# Patient Record
Sex: Female | Born: 1935 | Race: White | Hispanic: No
Health system: Southern US, Community
[De-identification: ages and names within clinical notes are randomized; demographics above are authoritative.]

---

## 2007-09-28 ENCOUNTER — Other Ambulatory Visit: Payer: Self-pay

## 2007-09-28 ENCOUNTER — Emergency Department: Payer: Self-pay | Admitting: Emergency Medicine

## 2008-04-15 ENCOUNTER — Emergency Department: Payer: Self-pay | Admitting: Emergency Medicine

## 2010-07-16 ENCOUNTER — Emergency Department: Payer: Self-pay | Admitting: Unknown Physician Specialty

## 2012-01-28 ENCOUNTER — Inpatient Hospital Stay: Payer: Self-pay | Admitting: General Practice

## 2012-01-28 LAB — URINALYSIS, COMPLETE
Bilirubin,UR: NEGATIVE
Blood: NEGATIVE
Ketone: NEGATIVE
Nitrite: NEGATIVE
Protein: NEGATIVE

## 2012-01-28 LAB — BASIC METABOLIC PANEL
Anion Gap: 8 (ref 7–16)
Calcium, Total: 9.8 mg/dL (ref 8.5–10.1)
EGFR (Non-African Amer.): 32 — ABNORMAL LOW
Glucose: 110 mg/dL — ABNORMAL HIGH (ref 65–99)
Osmolality: 296 (ref 275–301)
Potassium: 4.2 mmol/L (ref 3.5–5.1)
Sodium: 143 mmol/L (ref 136–145)

## 2012-01-28 LAB — CBC
HGB: 12.8 g/dL (ref 12.0–16.0)
MCH: 33.9 pg (ref 26.0–34.0)
MCHC: 34.9 g/dL (ref 32.0–36.0)
MCV: 97 fL (ref 80–100)
RBC: 3.79 10*6/uL — ABNORMAL LOW (ref 3.80–5.20)

## 2012-01-28 LAB — PROTIME-INR
INR: 1
Prothrombin Time: 13.5 secs (ref 11.5–14.7)

## 2012-01-29 LAB — CBC WITH DIFFERENTIAL/PLATELET
Eosinophil %: 0.3 %
HCT: 31.8 % — ABNORMAL LOW (ref 35.0–47.0)
Lymphocyte #: 1.1 10*3/uL (ref 1.0–3.6)
MCH: 33.2 pg (ref 26.0–34.0)
MCHC: 33.7 g/dL (ref 32.0–36.0)
MCV: 99 fL (ref 80–100)
Monocyte #: 0.9 x10 3/mm (ref 0.2–0.9)
Monocyte %: 7.5 %
Platelet: 131 10*3/uL — ABNORMAL LOW (ref 150–440)
RBC: 3.23 10*6/uL — ABNORMAL LOW (ref 3.80–5.20)
RDW: 12.1 % (ref 11.5–14.5)

## 2012-01-29 LAB — BASIC METABOLIC PANEL
Anion Gap: 8 (ref 7–16)
Calcium, Total: 8.2 mg/dL — ABNORMAL LOW (ref 8.5–10.1)
Chloride: 113 mmol/L — ABNORMAL HIGH (ref 98–107)
Co2: 22 mmol/L (ref 21–32)
Creatinine: 1.49 mg/dL — ABNORMAL HIGH (ref 0.60–1.30)
EGFR (African American): 36 — ABNORMAL LOW
Glucose: 149 mg/dL — ABNORMAL HIGH (ref 65–99)
Osmolality: 294 (ref 275–301)
Sodium: 143 mmol/L (ref 136–145)

## 2012-01-30 LAB — CBC WITH DIFFERENTIAL/PLATELET
Basophil %: 0.5 %
Eosinophil %: 2.7 %
HCT: 23.4 % — ABNORMAL LOW (ref 35.0–47.0)
HGB: 8.1 g/dL — ABNORMAL LOW (ref 12.0–16.0)
Lymphocyte #: 1.4 10*3/uL (ref 1.0–3.6)
Lymphocyte %: 20.4 %
MCH: 34.1 pg — ABNORMAL HIGH (ref 26.0–34.0)
MCV: 98 fL (ref 80–100)
Monocyte #: 0.6 x10 3/mm (ref 0.2–0.9)
Neutrophil #: 4.6 10*3/uL (ref 1.4–6.5)
RDW: 12.2 % (ref 11.5–14.5)
WBC: 6.9 10*3/uL (ref 3.6–11.0)

## 2012-01-30 LAB — BASIC METABOLIC PANEL
Anion Gap: 8 (ref 7–16)
BUN: 25 mg/dL — ABNORMAL HIGH (ref 7–18)
Chloride: 117 mmol/L — ABNORMAL HIGH (ref 98–107)
Co2: 21 mmol/L (ref 21–32)
Osmolality: 295 (ref 275–301)
Potassium: 3.9 mmol/L (ref 3.5–5.1)

## 2012-01-31 LAB — BASIC METABOLIC PANEL
Anion Gap: 9 (ref 7–16)
BUN: 17 mg/dL (ref 7–18)
Calcium, Total: 8.2 mg/dL — ABNORMAL LOW (ref 8.5–10.1)
Chloride: 114 mmol/L — ABNORMAL HIGH (ref 98–107)
Co2: 20 mmol/L — ABNORMAL LOW (ref 21–32)
Creatinine: 1.34 mg/dL — ABNORMAL HIGH (ref 0.60–1.30)
EGFR (African American): 41 — ABNORMAL LOW
Glucose: 121 mg/dL — ABNORMAL HIGH (ref 65–99)
Potassium: 3.8 mmol/L (ref 3.5–5.1)
Sodium: 143 mmol/L (ref 136–145)

## 2012-01-31 LAB — PATHOLOGY REPORT

## 2012-03-19 DEATH — deceased

## 2013-05-08 IMAGING — CR PELVIS - 1-2 VIEW
1 series · 1 of 1 positions shown · non-contrast
Comparison: none

REASON FOR EXAM: pain following trauma
COMMENTS:

[ap]
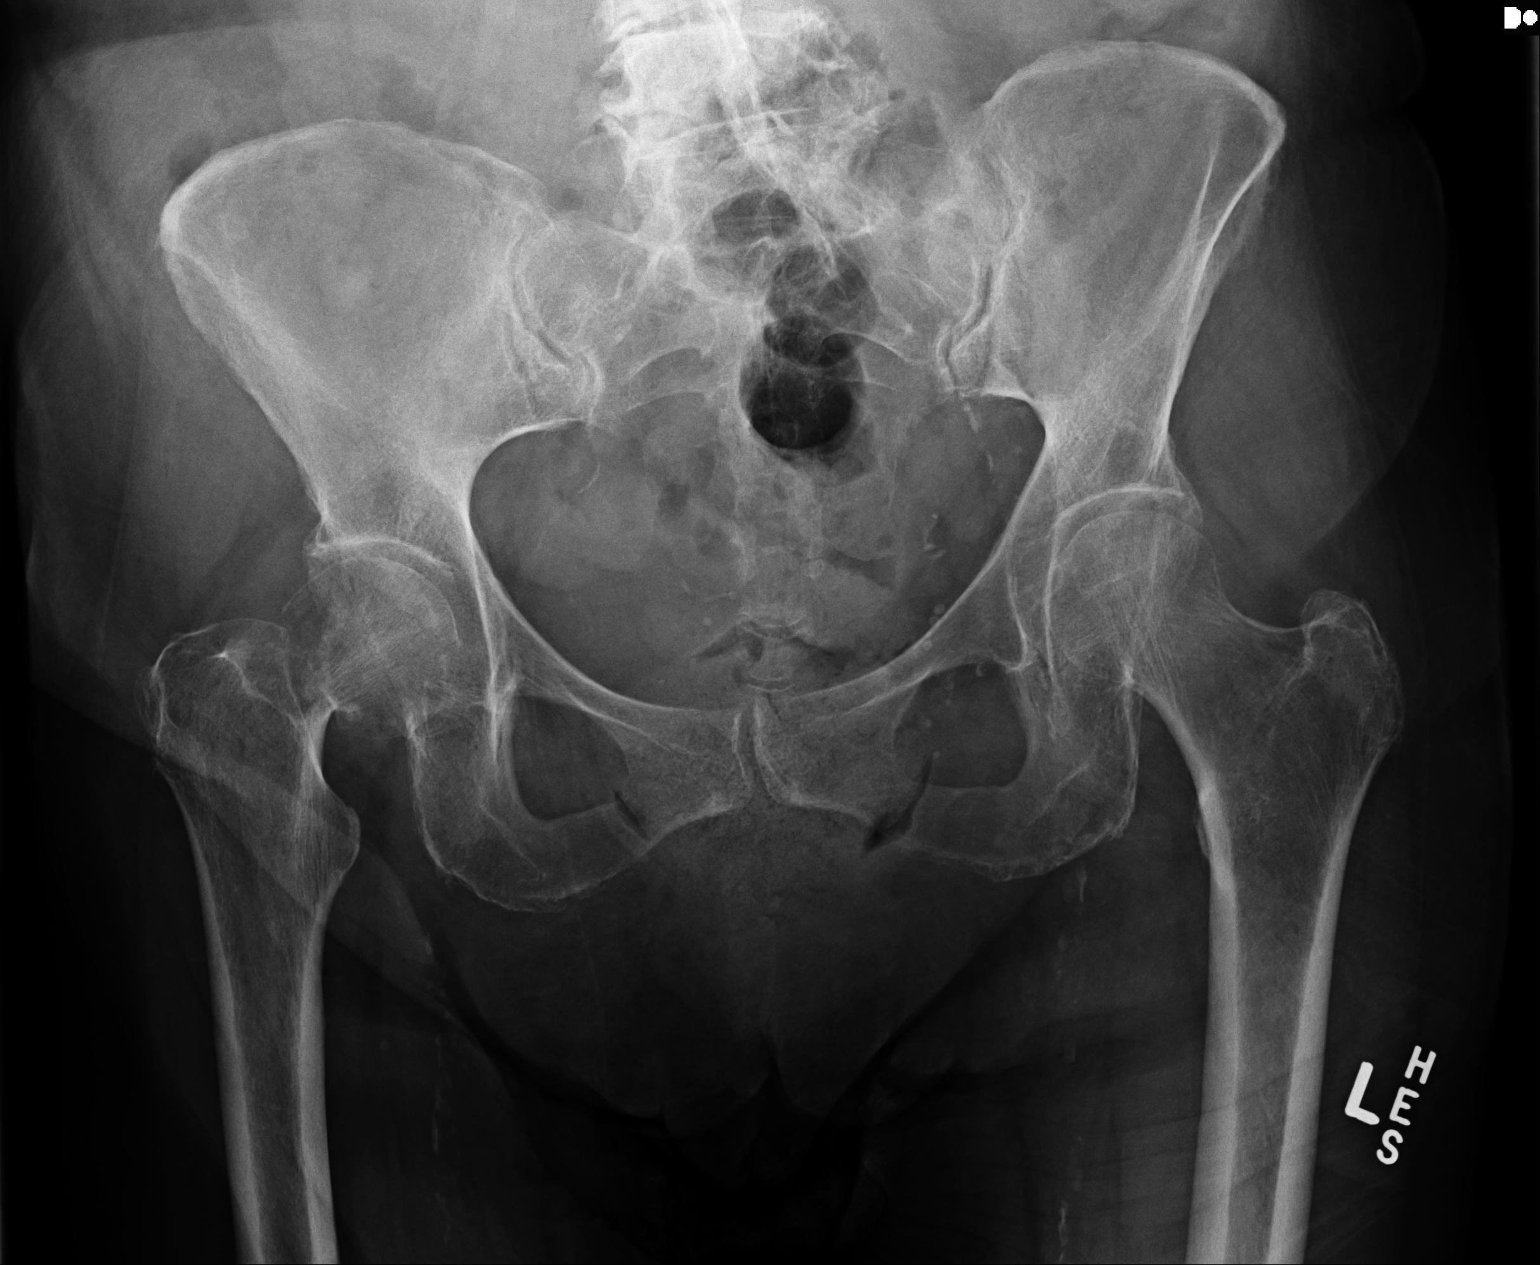

[1 of 1 positions shown; findings below may reference images not displayed]

PROCEDURE:     DXR - DXR PELVIS AP ONLY  - January 28, 2012 [DATE]

RESULT:     The bony pelvis is osteopenic. The patient has sustained an
acute subcapital fracture of the right hip. There are lucencies which appear
to lie outside the bone involving the inferior pubic rami bilaterally. There
is a moderate amount of stool in the rectum suggesting fecal impaction or
constipation. There are degenerative changes of the lower lumbar spine.
IMPRESSION: 1. The patient has sustained an acute subcapital fracture of the right hip.
There is diffuse osteopenia.
2. There is considerable stool present in the rectum.

[REDACTED]

## 2013-05-09 IMAGING — CR RIGHT HIP - COMPLETE 2+ VIEW
1 series · 3 of 3 positions shown · non-contrast
Comparison: none

REASON FOR EXAM: s/p hip hemiarthroplasty
COMMENTS:   Bedside (portable):Y

PROCEDURE:     DXR - DXR HIP RIGHT COMPLETE  - January 29, 2012  [DATE]
RESULT:     Comparison: 01/28/2012

[Series 1: ap · 0.17mm/px · 3 of 3 slices shown]
[im 1/3]
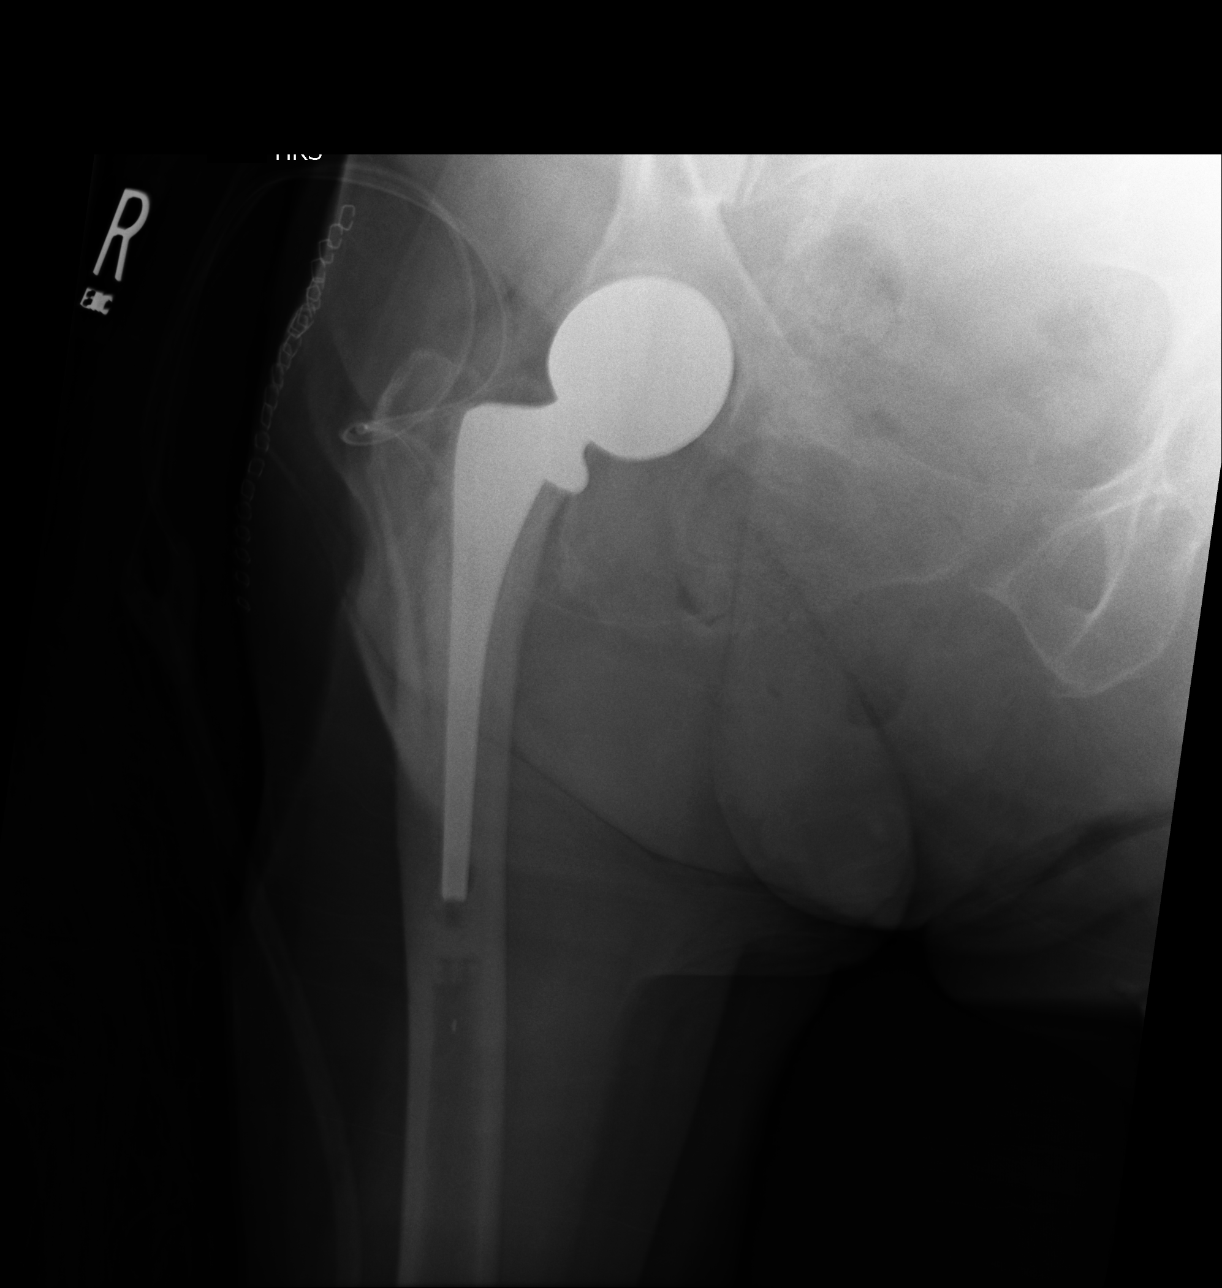
[im 2/3]
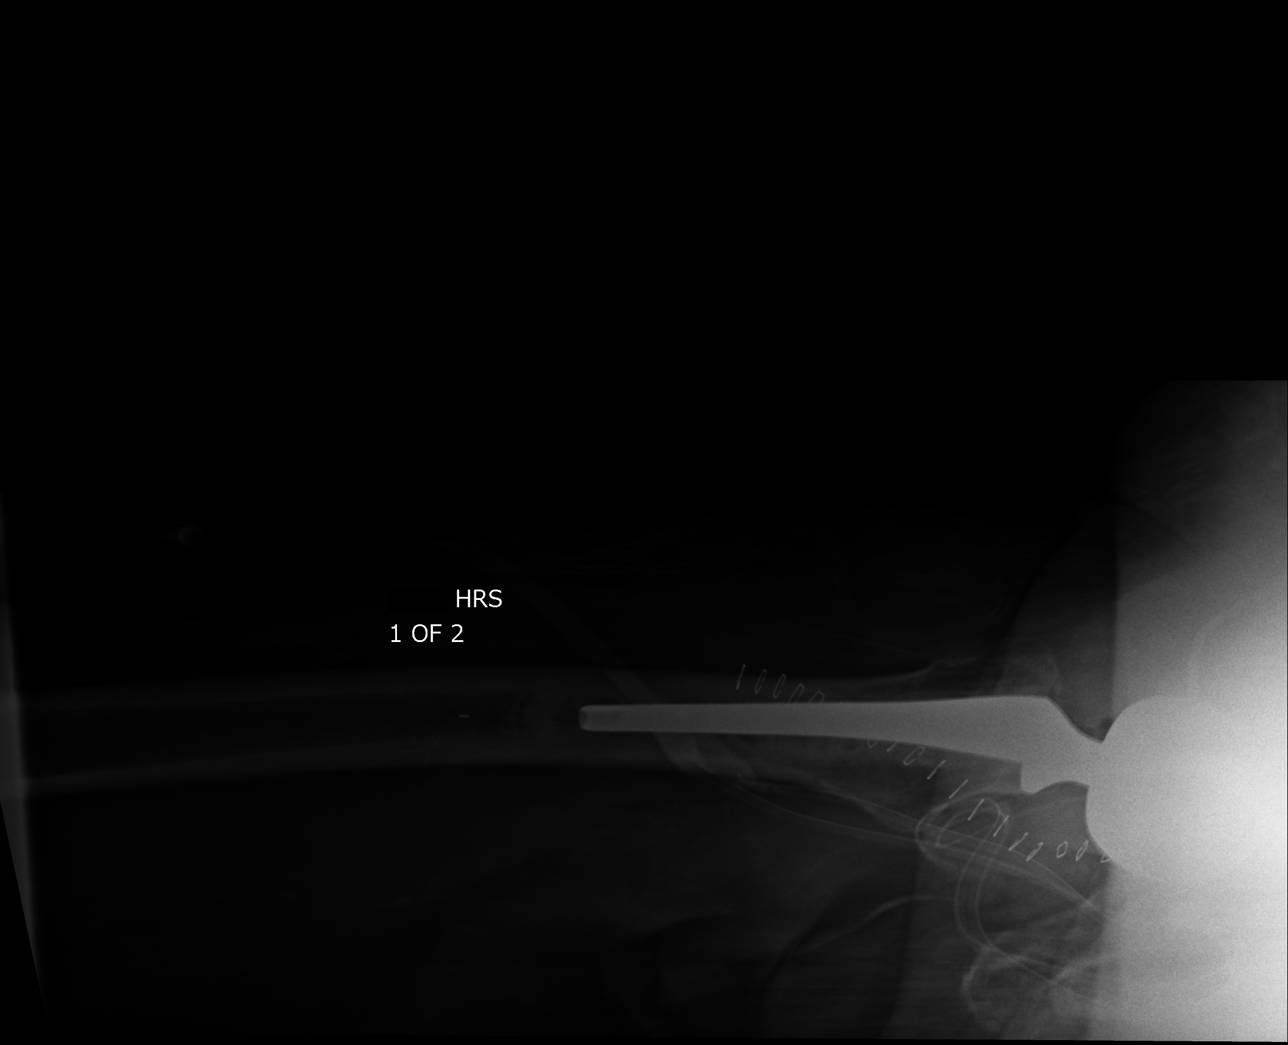
[im 3/3]
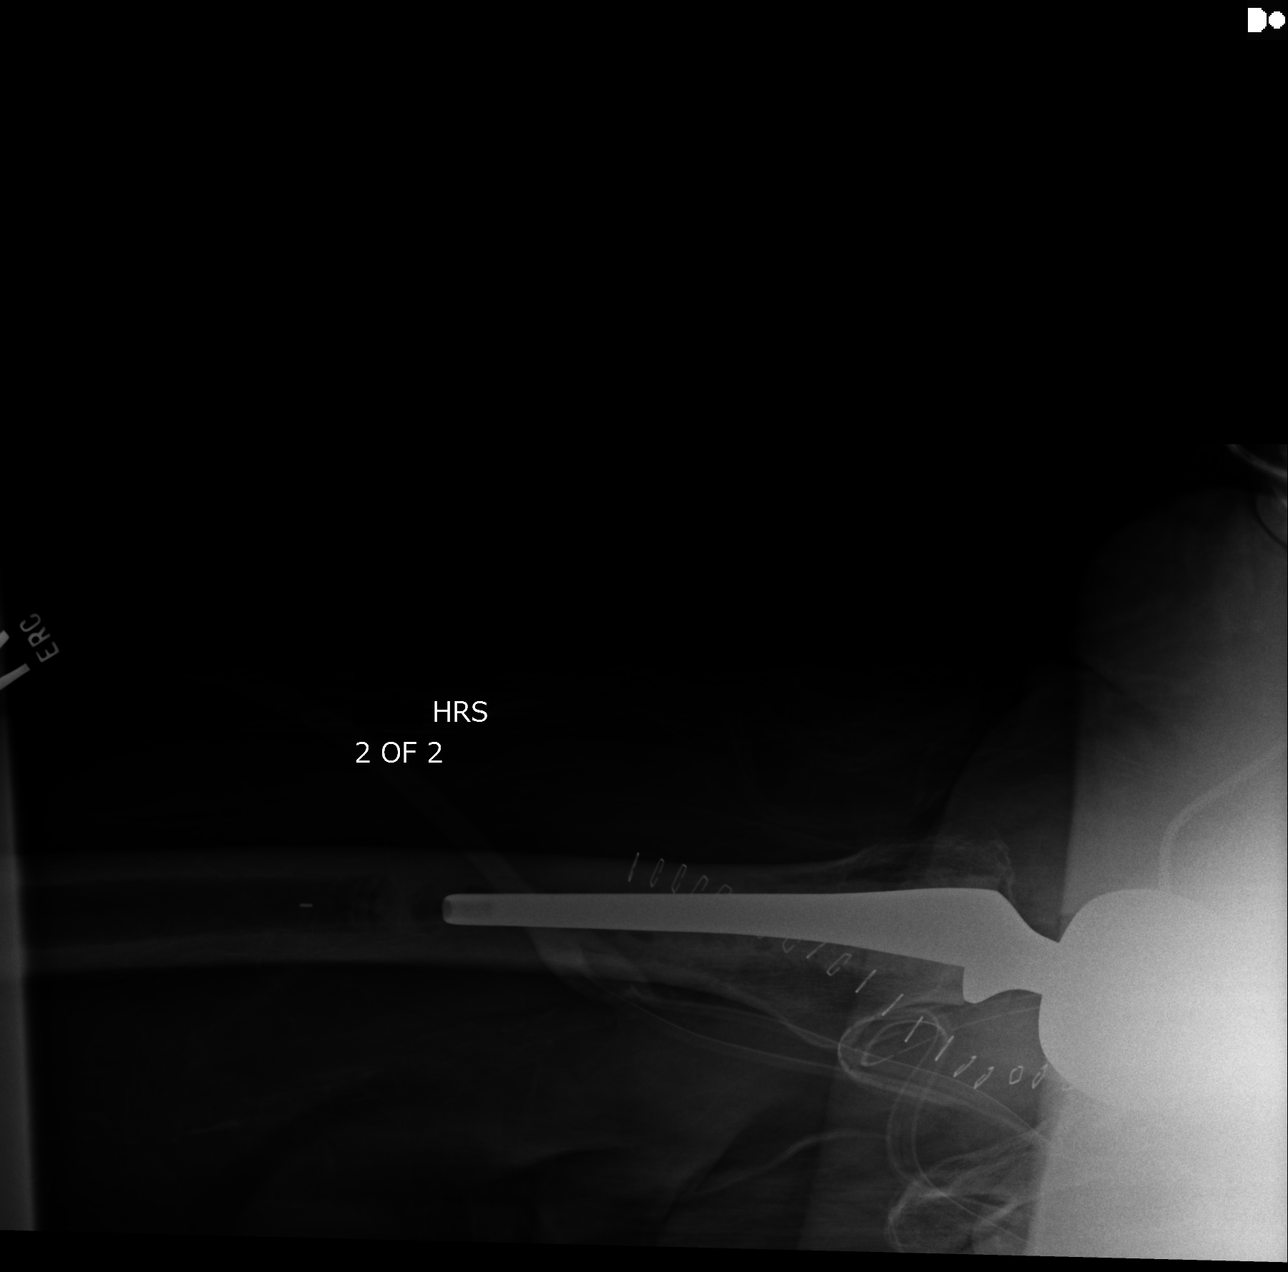

[3 of 3 positions shown; findings below may reference images not displayed]

FINDINGS: Hardware seen from right hip arthroplasty. Evaluation of the AP image is
slightly limited by motion artifact. Surgical drain and skin staples are
present.
IMPRESSION: Right hip arthroplasty.

## 2014-07-06 NOTE — Discharge Summary (Signed)
PATIENT NAME:  Morgan Strong, Morgan Strong MR#:  [id number] DATE OF BIRTH:  1921-10-02  DATE OF ADMISSION:  01/28/2012 DATE OF DISCHARGE:  01/31/2012   ADMITTING DIAGNOSIS: Displaced right femoral neck fracture.   DISCHARGE DIAGNOSIS: Displaced right femoral neck fracture.   HISTORY: The patient is an 81 year old female resident of a local assisted living facility who apparently had a mechanical fall on the day of admission. The patient is aphasic, but the staff noted that she was moaning with movement of the right leg and hip. The patient was ambulating with the assistance of a walker prior to the fall, but was becoming increasingly unstable. She subsequently was brought to [facility] ER where x-rays were taken of the hip and pelvis. This revealed a displaced right femoral neck fracture. After a discussion of the risks and benefits of surgical intervention with the patient's family members, they expressed their understanding of the risks and benefits and agreed for plans for surgical intervention.   CONSULTATION: Hospitalist, Dr. Allena Katz.   PROCEDURE: Right hip hemiarthroplasty.   ANESTHESIA: General.   IMPLANTS UTILIZED: DePuy size three Summit femoral component (cemented), 45 mm Cathcart hip ball, a +0 mm tapered spacer, and 11 mm centralizer, and a size three cement restrictor.   HOSPITAL COURSE: The patient tolerated the procedure very well. She had no complications. She was then taken to the PAC-U where she was stabilized and then transferred to the orthopedic floor. The patient began receiving anticoagulation therapy of Lovenox 30 mg subcutaneous every 12 hours per anesthesia protocol. She was fitted with TED stockings bilaterally. These were allowed to be removed one hour per eight hour shift. The patient was also fitted with the AV-I compression foot pumps bilaterally set at 80 mmHg. There has not been any evidence of any deep venous thromboses to the lower extremity. Calves  appear to be nontender. Negative Homans sign. Heels were elevated off the bed using rolled towels. The patient was also fitted with an abduction pillow at all times due to her dementia.   The patient has denied any chest pain or shortness of breath. Vital signs have been stable. Her pulse, however, has been running in the low 90s, but has been constant throughout the hospital course. The patient has been followed throughout the hospital course by Dr. Allena Katz and has been monitoring her vital signs and blood pressure medicines from a medical standpoint. The patient hemodynamically was stable and no transfusions were given. Her hemoglobin on day one following surgery did show a drop of 1.8 but this was from a 10.7 and was felt to be misread. A follow-up hemoglobin showed that she was 9.7. She was asymptomatic during this time frame.   Physical therapy was initiated on day one for gait training and transfers, however, due to her dementia this has been extremely difficult. She was able to ambulate 10 feet and also go to bed to chair with moderate assistance. Occupational therapy was also initiated on day one for activities of daily living and assistive devices.   The patient's IV, Foley and Hemovac were discontinued on day two along with a dressing change. The wound was free of any drainage or signs of infection. There was no bruising noted upon being discharged. The heels were nontender.   The patient is being discharged to skilled nursing facility in improved stable condition.   DISCHARGE INSTRUCTIONS:  1. She will continue with physical therapy for gait training and transfers, occupational therapy for activities of daily living and  assistive devices.  2. TED stockings to be worn at all times. These may be removed one hour per eight hour shift.  3. Elevate the heels off the bed. Abduction pillows between legs when in bed.  4. She may weight bear as tolerated.  5. Incentive spirometry every one hour while  awake.  6. Encourage cough and deep breathing every two hours while awake.  7. She is placed on a regular diet.  8. She will need to follow-up in the clinic in six weeks.  9. Staples will need to be removed at two weeks postoperative followed by the application of benzoin and Steri-Strips.   DRUG ALLERGIES: Sulfa.   MEDICATIONS:  1. Tylenol ES 500 to 1000 mg every four hours p.r.n. for pain and temperatures of greater than 100.4.  2. Roxicodone 5 to 10 mg every 4 to 6 hours p.r.n. for pain.  3. Ultram 50 to 100 mg every four hours p.r.n. for pain.  4. Milk of magnesia 30 mL b.i.d. p.r.n.  5. Dulcolax suppositories 10 mg rectally daily p.r.n. for constipation.  6. Enema soapsuds if no results with milk of magnesia or Dulcolax.  7. Mylanta DS 30 mL every six hours p.r.n.  8. Pantoprazole 40 mg b.i.d.  9. Risperdal 0.25 mg every six hours p.r.n.  10. Aricept 10 mg at bedtime. 11. Macrodantin 100 mg for three days and then discontinue.  12. Vitamin D3 1000 units daily.  13. Insulin sliding scale. 14. Novolin R injections. 15. Lovenox 30 mg subcutaneous q.12 hours for 14 days, then discontinue and begin taking one 81 mg enteric-coated aspirin.   PAST MEDICAL HISTORY:   1. Dementia.  2. Hyperlipidemia.  3. Goiter.  4. Osteoporosis.  5. Hypertension.  6. Diabetes type 2.  ____________________________ Van ClinesJon Wolfe, PA jrw:ap D: 01/31/2012 07:28:59 ET T: 01/31/2012 08:27:57 ET JOB#: [id number]  cc: Van ClinesJon Wolfe, PA, <Dictator>  JON WOLFE PA ELECTRONICALLY SIGNED 01/31/2012 20:20

## 2014-07-06 NOTE — H&P (Signed)
Subjective/Chief Complaint Right hip pain    History of Present Illness 81 year old female resident of a local assited iving facility who apparently had a mechanical fall. Patient is aphasic, but staff noted thet moaning with movement of the right leg and hip. Patient ambulated aids before this fall but was becoming increasingly unstable.   Past Med/Surgical Hx:  Dementia:   hyperlipidemia:   goiter:   osteoporosis:   HTN:   DM II:   ALLERGIES:  Sulfa: Rash  HOME MEDICATIONS: Medication Instructions Status  Benicar HCT 20 mg-12.5 mg oral tablet 1 tab(s) orally once a day (8 am) Active  metformin 500 mg oral tablet 0.5 tab(s) (250 mg) orally once a day with food (8 am) Active  nitrofurantoin macrocrystals 50 mg oral capsule 1 cap(s) orally once a day with food (8 am) Active  Senexon-S 50 mg-8.6 mg oral tablet 1 tab(s) orally once a day (8 am) Active  Vitamin D3 1000 intl units oral tablet 1 tab(s) orally once a day (8 am) Active  donepezil 10 mg oral tablet 1 tab(s) orally once a day (at bedtime) (8 pm) Active  Milk of Magnesia 8% oral suspension 30 milliliter(s) orally once a day, As Needed- for Constipation  Active  risperidone 0.25 mg oral tablet 1 tab(s) orally once a day, As Needed- for Agitation  Active  Secura Extra Protective topical cream Apply topically to redness of buttocks 2 times a day to prevent breakdown Active   Family and Social History:   Family History Non-Contributory    Social History negative tobacco, negative ETOH, negative Illicit drugs    Place of Living Nursing Home   Review of Systems:   ROS Pt not able to provide ROS   Physical Exam:   GEN well developed, well nourished, no acute distress, disheveled    HEENT pink conjunctivae, PERRL, Oropharynx clear    NECK supple    RESP normal resp effort  clear BS  postive use of accessory muscles    CARD regular rate  no murmur  No LE edema  no JVD    ABD denies tenderness  soft    GU foley  catheter in place    EXTR Right lower extremity is shortened and externally rotated. Pain is elicited with attempts at range of motion of the hip. No gross tenderness to palpation of the knee. No gross knee effusion.    SKIN Ato left pretibial area.    NEURO aphasic, motor/sensory function intact    PSYCH alert, poor insight   Lab Results: Routine BB:  11-Nov-13 12:11    ABO Group + Rh Type A Positive   Antibody Screen NEGATIVE (Result(s) reported on 28 Jan 2012 at 01:16PM.)  Routine Chem:  11-Nov-13 12:11    Glucose, Serum  110   BUN  42   Creatinine (comp)  1.45   Sodium, Serum 143   Potassium, Serum 4.2   CO2, Serum 23   Calcium (Total), Serum 9.8   Anion Gap 8   Osmolality (calc) 296   eGFR (African American)  37   eGFR (Non-African American)  32 (eGFR values <72mL/min/1.73 m2 may be an indication of chronic kidney disease (CKD). Calculated eGFR is useful in patients with stable renal function. The eGFR calculation will not be reliable in acutely ill patients when serum creatinine is changing rapidly. It is not useful in  patients on dialysis. The eGFR calculation may not be applicable to patients at the low and high  extremes of body sizes, pregnant women, and vegetarians.)  Routine UA:  11-Nov-13 12:48    Color (UA) Yellow   Clarity (UA) Cloudy   Glucose (UA) Negative   Bilirubin (UA) Negative   Ketones (UA) Negative   Specific Gravity (UA) 1.017   Blood (UA) Negative   pH (UA) 5.0   Protein (UA) Negative   Nitrite (UA) Negative   Leukocyte Esterase (UA) 2+ (Result(s) reported on 28 Jan 2012 at 01:23PM.)   RBC (UA) 3 /HPF   WBC (UA) 32 /HPF   Bacteria (UA) 3+   Epithelial Cells (UA) NONE SEEN   Mucous (UA) PRESENT (Result(s) reported on 28 Jan 2012 at 01:23PM.)  Routine Coag:  11-Nov-13 12:11    Prothrombin 13.5   INR 1.0 (INR reference interval applies to patients on anticoagulant therapy. A single INR therapeutic range for coumarins is not optimal for  all indications; however, the suggested range for most indications is 2.0 - 3.0. Exceptions to the INR Reference Range may include: Prosthetic heart valves, acute myocardial infarction, prevention of myocardial infarction, and combinations of aspirin and anticoagulant. The need for a higher or lower target INR must be assessed individually. Reference: The Pharmacology and Management of the Vitamin K  antagonists: the seventh ACCP Conference on Antithrombotic and Thrombolytic Therapy. BEMLJ.4492 Sept:126 (3suppl): [id number]. A HCT value >55% may artifactually increase the PT.  In one study,  the increase was an average of 25%. Reference:  "Effect on Routine and Special Coagulation Testing Values of Citrate Anticoagulant Adjustment in Patients with High HCT Values." American Journal of Clinical Pathology 2006;126:400-405.)  Routine Hem:  11-Nov-13 12:11    WBC (CBC) 7.2   RBC (CBC)  3.79   Hemoglobin (CBC) 12.8   Hematocrit (CBC) 36.8   Platelet Count (CBC)  149 (Result(s) reported on 28 Jan 2012 at 12:45PM.)   MCV 97   MCH 33.9   MCHC 34.9   RDW 12.3     Assessment/Admission Diagnosis Right neck fracture Urinary tract infection Diabetes Dementia/aphsia    Plan Recommend right hi hemiarthroplasty.  The risks and benefits of surgical intervention were discussed in detail with the patient's granddaughter and son.. Ther expressed understanding of the risks and benefits and agreed with plans for surgery.   The potential risks and benefits of blood transfusion have been discussed with the patient.The patient expressed understanding of the risks and benefits and has signed the appropriate consent for blood transfusion.   Electronic Signatures: Dereck Leep (MD)  (Signed 11-Nov-13 16:41)  Authored: CHIEF COMPLAINT and HISTORY, PAST MEDICAL/SURGIAL HISTORY, ALLERGIES, HOME MEDICATIONS, FAMILY AND SOCIAL HISTORY, REVIEW OF SYSTEMS, PHYSICAL EXAM, LABS, ASSESSMENT AND PLAN   Last  Updated: 11-Nov-13 16:41 by Dereck Leep (MD)

## 2014-07-06 NOTE — Op Note (Signed)
PATIENT NAME:  Morgan Strong, Morgan Strong DATE OF BIRTH:  Sep 01, 1921  DATE OF PROCEDURE:  01/28/2012  PREOPERATIVE DIAGNOSIS: Displaced right femoral neck fracture.   POSTOPERATIVE DIAGNOSIS:  Displaced right femoral neck fracture.   PROCEDURE PERFORMED: Right hip hemiarthroplasty.   SURGEON: Morgan LabradorJames P. Morgan FavaHooten Strong., Strong.   ANESTHESIA: General.   ESTIMATED BLOOD LOSS: 200 mL.   FLUIDS REPLACED: 1,200 mL of crystalloid.   DRAINS: Two medium drains to Hemovac reservoir.   IMPLANTS UTILIZED: DePuy size three Summit femoral component (cemented), 45 mm Cathcart hip ball, a +0 mm tapered spacer, an 11 mm cementralizer, and a size three cement restrictor.   INDICATIONS FOR SURGERY: The patient is an 81 year old female who fell at her assisted living facility and landed on her right hip and leg. X-rays obtained at [facility] Emergency Room demonstrated a displaced right femoral neck fracture. After a discussion of the risks and benefits of surgical intervention with the patient's family, they expressed their understanding of the risks, benefits, and agreed with plans for surgical intervention.   PROCEDURE IN DETAIL: The patient was brought into the Operating Room and, after adequate general endotracheal anesthesia was achieved, the patient was placed in a left lateral decubitus position. An axillary roll was placed and all bony prominences were well padded. The patient's right hip and leg were cleaned and prepped with alcohol and DuraPrep and draped in the usual sterile fashion. A "time-out" was performed as per usual protocol. A lateral curvilinear incision was made gently curving towards the posterior superior iliac spine. IT band was incised in line with skin incisions. Fibers of the gluteus maximus were split in line. The piriformis tendon was identified, skeletonized, and incised at its insertion at the proximal femur and reflected posteriorly. In a similar fashion, short  external rotators were incised and reflected posteriorly. A T-type posterior capsulotomy was performed. There was gross comminution of the femoral neck. A corkscrew device was used to remove the femoral head. Measurements were obtained. It was felt that a 45 mm Cathcart hip ball was the appropriate size.   The femoral neck cut was performed using an oscillating saw. A pilot hole for reaming of the canal was prepared and conical reamer was then advanced. The proximal femur was broached in a sequential fashion up to a size three broach. The calcar region was planed and trial reduction was performed using a 45 mm Cathcart hip ball with a +0 neck segment. This allowed for excellent equalization of limb lengths and excellent stability both anteriorly and posteriorly. Trial components were removed. Canal was sized and it was felt that a size three cement restrictor was appropriate. A size three cement restrictor was inserted to the appropriate depth. The proximal femur was then irrigated with copious amounts of normal saline with antibiotic solution using pulsatile lavage and then suctioned dry. The canal was then packed with vaginal packing and soaked in dilute Neo-Synephrine. Polymethyl methacrylate cement was prepared in the usual fashion using a vacuum mixer. Vaginal packing was removed and the canal again irrigated and suctioned dry. Cement was introduced in a retrograde fashion and then pressurized. A size three Summit femoral component with an 11 mm distal cementralizer was positioned and impacted into place. Excess cement was removed using freer elevators.   After adequate curing of the cement, the acetabulum was inspected for any fracture debris. The trunnion was cleaned and dried and a 45 mm Cathcart hip ball with a +0 mm tapered spacer  was placed on the trunnion and impacted into place. The hip was reduced and placed through a range of motion. Excellent stability was appreciated and good equalization of  limb lengths appreciated.   The wound was irrigated with copious amounts of normal saline with antibiotic solution using pulsatile lavage and then suctioned dry. The posterior capsulotomy was repaired using #5 Ethibond. The piriformis tendon was reapproximated on the undersurface of the gluteus medius tendon using #5 Ethibond. Two medium drains were placed in the wound bed and brought out through a separate stab incision to be attached to a Hemovac reservoir.   The IT band was repaired using interrupted sutures of #1 Vicryl. The subcutaneous tissue was approximated in layers using first #0 Vicryl followed by 2-0 Vicryl.      The skin was closed with skin staples. A sterile dressing was applied. The patient tolerated the procedure well. She was transported to the recovery room in stable condition.     ____________________________ Morgan Strong., Strong jph:ap D: 01/29/2012 02:48:15 ET T: 01/29/2012 10:49:26 ET JOB#: [id number]  cc: Morgan Strong., Strong, <Dictator> Morgan Strong Morgan Fava Strong ELECTRONICALLY SIGNED 01/30/2012 4:43

## 2014-07-06 NOTE — Consult Note (Signed)
PATIENT NAME:  Morgan Strong, Morgan Strong DATE OF BIRTH:  1921-11-16  DATE OF CONSULTATION:  01/28/2012  REFERRING PHYSICIAN:  Dr. Ernest PineHooten  CONSULTING PHYSICIAN:  Morgan Boesen H. Allena KatzPatel, MD  PRIMARY CARE PHYSICIAN: Dr. Patrecia PaceMorayati    REASON FOR CONSULTATION: Opinion regarding patient's hyperlipidemia, hypertension, diabetes, Alzheimer's dementia as well as preop evaluation.   HISTORY OF PRESENT ILLNESS: Patient is an 81 year old white female with advanced dementia who apparently fell today and has a right hip fracture. We are asked to see the patient regarding preop evaluation. Patient's daughter is at the bedside and she reports that she does ambulate somewhat. But mostly she is bedbound, wheelchair bound. Patient is unable to give any history. According to the daughter there is no history of any cardiac problems that she is aware of. Patient does not complain of any symptoms due to her advanced dementia and she is confused at baseline and intermittently agitated.   REVIEW OF SYSTEMS: Unobtainable.   PAST MEDICAL HISTORY:  1. History of hyperlipidemia.  2. History of goiter.  3. History of osteoporosis.  4. Hypertension.  5. Diabetes type 2.  6. Alzheimer's dementia.   PAST SURGICAL HISTORY: Unknown. According to the daughter she has not recalled any surgery since he was born.   ALLERGIES: Sulfa.   CURRENT MEDICATIONS:  1. Benicar HCTZ 20/12.5 daily.  2. Donepezil 10, 1 tab p.o. at bedtime.  3. Metformin 500 mg half tab daily with food. 4. Milk of magnesia 30 mL daily.  5. Nitrofurantoin 50, 1 tab p.o. daily with food.  6. Risperidone 0.25, 1 tab p.o. daily as needed for agitation. 7. Secure extra protective topical cream for skin breakdown.  8. Senexon-S 50/8.6, 1 tab p.o. daily.  9. Vitamin D3 1000 international units daily.   SOCIAL HISTORY: Nonsmoker. No alcohol or drug use.   FAMILY HISTORY: Positive for hypertension.   REVIEW OF SYSTEMS: Unobtainable due to patient's  advanced dementia.   PHYSICAL EXAMINATION:  VITAL SIGNS: Temperature 95.4, pulse 61, respirations 16, blood pressure 141/61, O2 95% on room air.   GENERAL: Patient is an elderly female in no acute distress.   HEENT: Head atraumatic, normocephalic. Pupils equally round, reactive to light and accommodation. There is no conjunctival pallor. Unable to do extraocular movements. Oropharynx: She refuses to open her mouth.   NECK: No thyromegaly. No carotid bruits.   CARDIOVASCULAR: Regular rate and rhythm. No murmurs, rubs, clicks, or gallops. PMI is not displaced.   LUNGS: Clear to auscultation bilaterally without any rales, rhonchi, wheezing.   ABDOMEN: Soft, nontender, nondistended. Positive bowel sounds x4. There is no hepatosplenomegaly.   EXTREMITIES: No clubbing, cyanosis, edema.   SKIN: No rash.   LYMPHATICS: No lymph nodes palpable.   VASCULAR: Good DP, PT pulses.   PSYCHIATRIC: Patient is currently not anxious or depressed.   NEUROLOGICAL: Unable to do due to patient unable to follow commands.   LABORATORY, DIAGNOSTIC AND RADIOLOGICAL DATA: Only evaluation so far is her EKG which shows sinus bradycardia with first degree heart block without any ST-T wave changes.   ASSESSMENT AND PLAN: Patient is an 81 year old white female with advanced dementia, able to walk some, fell, now has a right hip fracture.  1. Preop. Patient has EKG changes with sinus bradycardia but no other changes. Unable to obtain any symptom history. No previous history of coronary artery disease as per history. Currently her lab work is pending. If her CBC and BMP is acceptable then patient is okay to proceed to  surgery. Due to her advanced age she is at moderate risk of complications including postoperative delirium and psychosis. I have discussed this with the daughter. She understands and she wishes to proceed with surgery once she is evaluated by ortho.  2. Hypertension. Will hold HCTZ and Benicar, will  resume these postoperative.  3. Diabetes type 2. Will place her on sliding scale insulin for now. Hold metformin.  4. Alzheimer's dementia. Will likely get very agitated. Continue p.r.n. risperidone as taking at home.  5. Miscellaneous. Recommend deep vein thrombosis prophylaxis postoperative. Incentive spirometry if patient able to follow commands.   TIME SPENT: 35 minutes.   ____________________________ Lacie Scotts Allena Katz, MD shp:cms D: 01/28/2012 12:57:14 ET T: 01/28/2012 14:05:13 ET JOB#: [id number]  cc: Earlyn Sylvan H. Allena Katz, MD, <Dictator> Alan Mulder, MD Charise Carwin MD ELECTRONICALLY SIGNED 02/04/2012 10:24
